# Patient Record
Sex: Female | Born: 1955 | Hispanic: Yes | Marital: Married | Smoking: Former smoker
Health system: Southern US, Community
[De-identification: ages and names within clinical notes are randomized; demographics above are authoritative.]

## PROBLEM LIST (undated history)

## (undated) DIAGNOSIS — Z789 Other specified health status: Secondary | ICD-10-CM

## (undated) HISTORY — DX: Other specified health status: Z78.9

---

## 2020-04-06 ENCOUNTER — Other Ambulatory Visit: Payer: Self-pay

## 2020-04-06 ENCOUNTER — Ambulatory Visit (INDEPENDENT_AMBULATORY_CARE_PROVIDER_SITE_OTHER): Payer: Medicare HMO | Admitting: Sports Medicine

## 2020-04-06 ENCOUNTER — Ambulatory Visit (INDEPENDENT_AMBULATORY_CARE_PROVIDER_SITE_OTHER): Payer: Medicare HMO

## 2020-04-06 ENCOUNTER — Encounter: Payer: Self-pay | Admitting: Sports Medicine

## 2020-04-06 DIAGNOSIS — M170 Bilateral primary osteoarthritis of knee: Secondary | ICD-10-CM | POA: Diagnosis not present

## 2020-04-06 MED ORDER — MELOXICAM 15 MG PO TABS: ORAL_TABLET | ORAL | 3 refills | Status: AC

## 2020-04-06 NOTE — Progress Notes (Addendum)
    Procedures performed today:    Procedure: Real-time Ultrasound Guided  aspiration/injection of the right knee Device: Samsung HS60  Verbal informed consent obtained.  Time-out conducted.  Noted no overlying erythema, induration, or other signs of local infection.  Skin prepped in a sterile fashion.  Local anesthesia: Topical Ethyl chloride.  With sterile technique and under real time ultrasound guidance:  Using an 18-gauge needle aspirated 8 mL of clear, straw-colored fluid, syringe switched and 1 cc Kenalog 40, 2 cc lidocaine, 2 cc bupivacaine injected easily Completed without difficulty  Pain immediately resolved suggesting accurate placement of the medication.  Advised to call if fevers/chills, erythema, induration, drainage, or persistent bleeding.  Images permanently stored and available for review in the ultrasound unit.  Impression: Technically successful ultrasound guided injection.  Independent interpretation of notes and tests performed by another provider:   None.  Brief History, Exam, Impression, and Recommendations:    Primary osteoarthritis of both knees This is a very pleasant 64 year old female, she is had months of pain in both knees, right worse than left with significant swelling, medial joint line pain. Aspiration and injection today per patient request, adding meloxicam, home rehab exercises, return to see me in 1 month.    ___________________________________________ Ihor Austin. Benjamin Stain, M.D., ABFM., CAQSM. Primary Care and Sports Medicine [facility]  Adjunct Instructor of Family Medicine  [employer]

## 2020-04-06 NOTE — Assessment & Plan Note (Signed)
This is a very pleasant 64 year old female, she is had months of pain in both knees, right worse than left with significant swelling, medial joint line pain. Aspiration and injection today per patient request, adding meloxicam, home rehab exercises, return to see me in 1 month.

## 2020-05-04 ENCOUNTER — Ambulatory Visit (INDEPENDENT_AMBULATORY_CARE_PROVIDER_SITE_OTHER): Payer: Medicare HMO | Admitting: Sports Medicine

## 2020-05-04 ENCOUNTER — Telehealth: Payer: Self-pay | Admitting: Sports Medicine

## 2020-05-04 ENCOUNTER — Encounter: Payer: Self-pay | Admitting: Sports Medicine

## 2020-05-04 ENCOUNTER — Other Ambulatory Visit: Payer: Self-pay

## 2020-05-04 DIAGNOSIS — M170 Bilateral primary osteoarthritis of knee: Secondary | ICD-10-CM

## 2020-05-04 MED ORDER — PREDNISONE 50 MG PO TABS: ORAL_TABLET | ORAL | 0 refills | Status: AC

## 2020-05-04 NOTE — Progress Notes (Signed)
Patient states Moderna vaccin in April. Unsure of the dates. Advised her to callback with dates from her vaccine card.

## 2020-05-04 NOTE — Progress Notes (Signed)
    Procedures performed today:    None.  Independent interpretation of notes and tests performed by another provider:   None.  Brief History, Exam, Impression, and Recommendations:    Primary osteoarthritis of both knees Nancy Salas returns, she is a pleasant 64 year old female with bilateral right worse than left knee osteoarthritis, we injected her knee at the last visit, she is doing a lot better, only has very little discomfort, she still has some pain at the medial joint line. I am going to go ahead and get her approved for Orthovisc, we are going to start prednisone, I think she does have some left L4 distribution radicular symptoms as well, and if this does not work then certainly we will proceed with viscosupplementation.    ___________________________________________ Ihor Austin. Benjamin Stain, M.D., ABFM., CAQSM. Primary Care and Sports Medicine [facility]  Adjunct Instructor of Family Medicine  [employer]

## 2020-05-04 NOTE — Assessment & Plan Note (Signed)
Nancy Salas returns, she is a pleasant 64 year old female with bilateral right worse than left knee osteoarthritis, we injected her knee at the last visit, she is doing a lot better, only has very little discomfort, she still has some pain at the medial joint line. I am going to go ahead and get her approved for Orthovisc, we are going to start prednisone, I think she does have some left L4 distribution radicular symptoms as well, and if this does not work then certainly we will proceed with viscosupplementation.

## 2020-05-04 NOTE — Telephone Encounter (Signed)
Orthovisc approval please, right knee, failed NSAIDs, steroid injection, x-ray confirmed

## 2020-05-05 NOTE — Telephone Encounter (Signed)
Started authorization process for orthovisc. - CF

## 2020-08-11 NOTE — Telephone Encounter (Signed)
Patient was approved for Arnoldo Morale and Annette Stable when I called to give her pricing she stated she would speak with her husband and call us back to let us know if she was going to get injections. (04/2020)

## 2020-11-12 IMAGING — DX DG KNEE COMPLETE 4+V*R*
4 series · 4 of 4 positions shown · non-contrast
Comparison: None.

CLINICAL DATA: Several months of bilateral medial knee pain, right
greater than, with swelling. No reported injury.

EXAM:
RIGHT KNEE - COMPLETE 4+ VIEW

[knee lat]
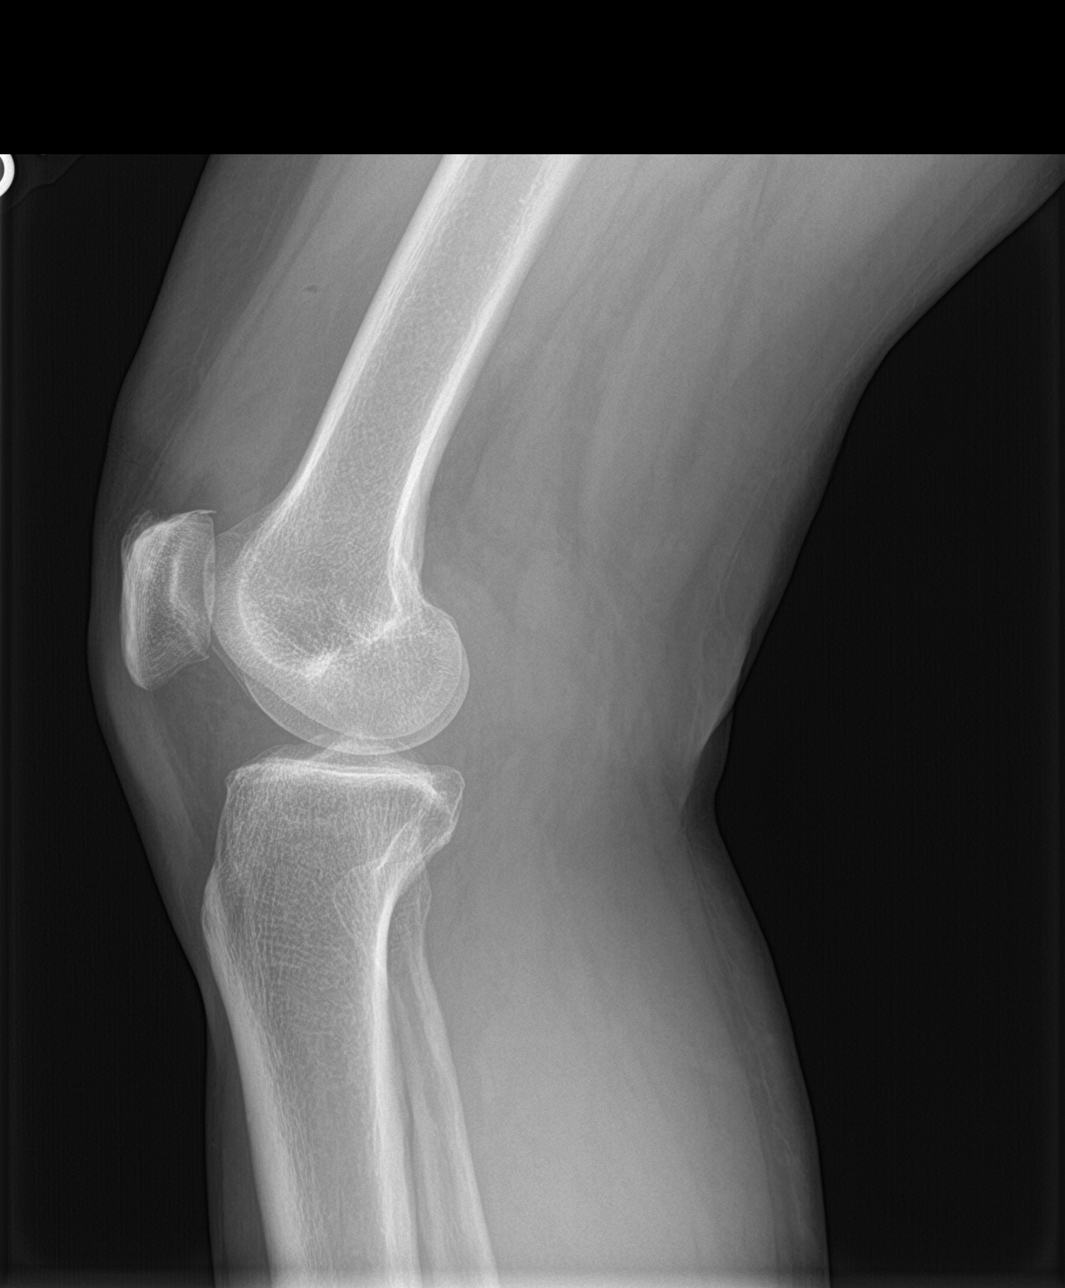

[knee sunrise]
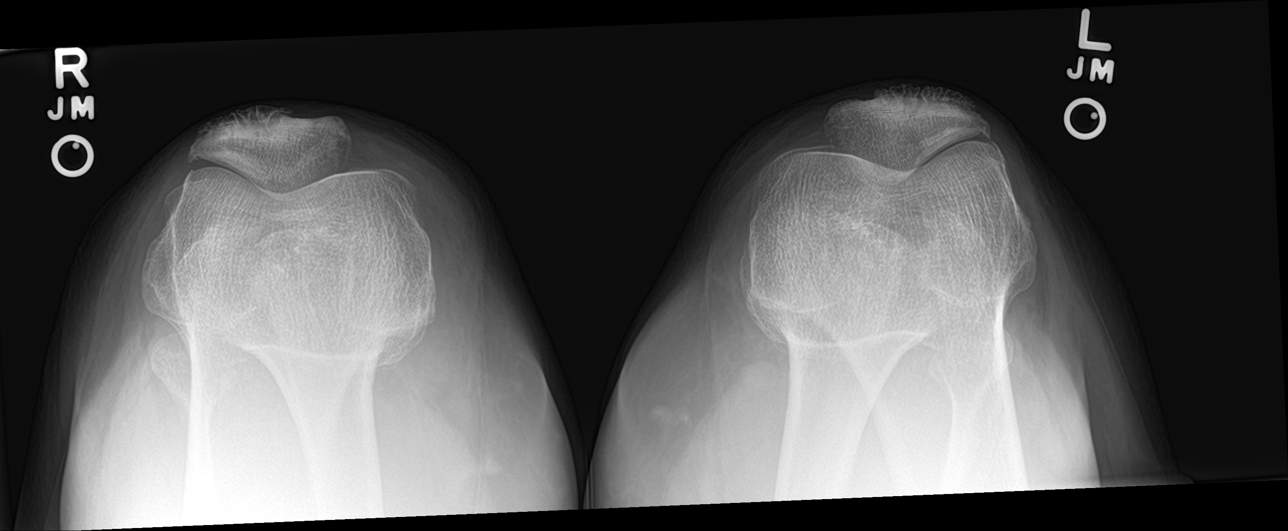

[knee ap bilat standing (1 of 2)]
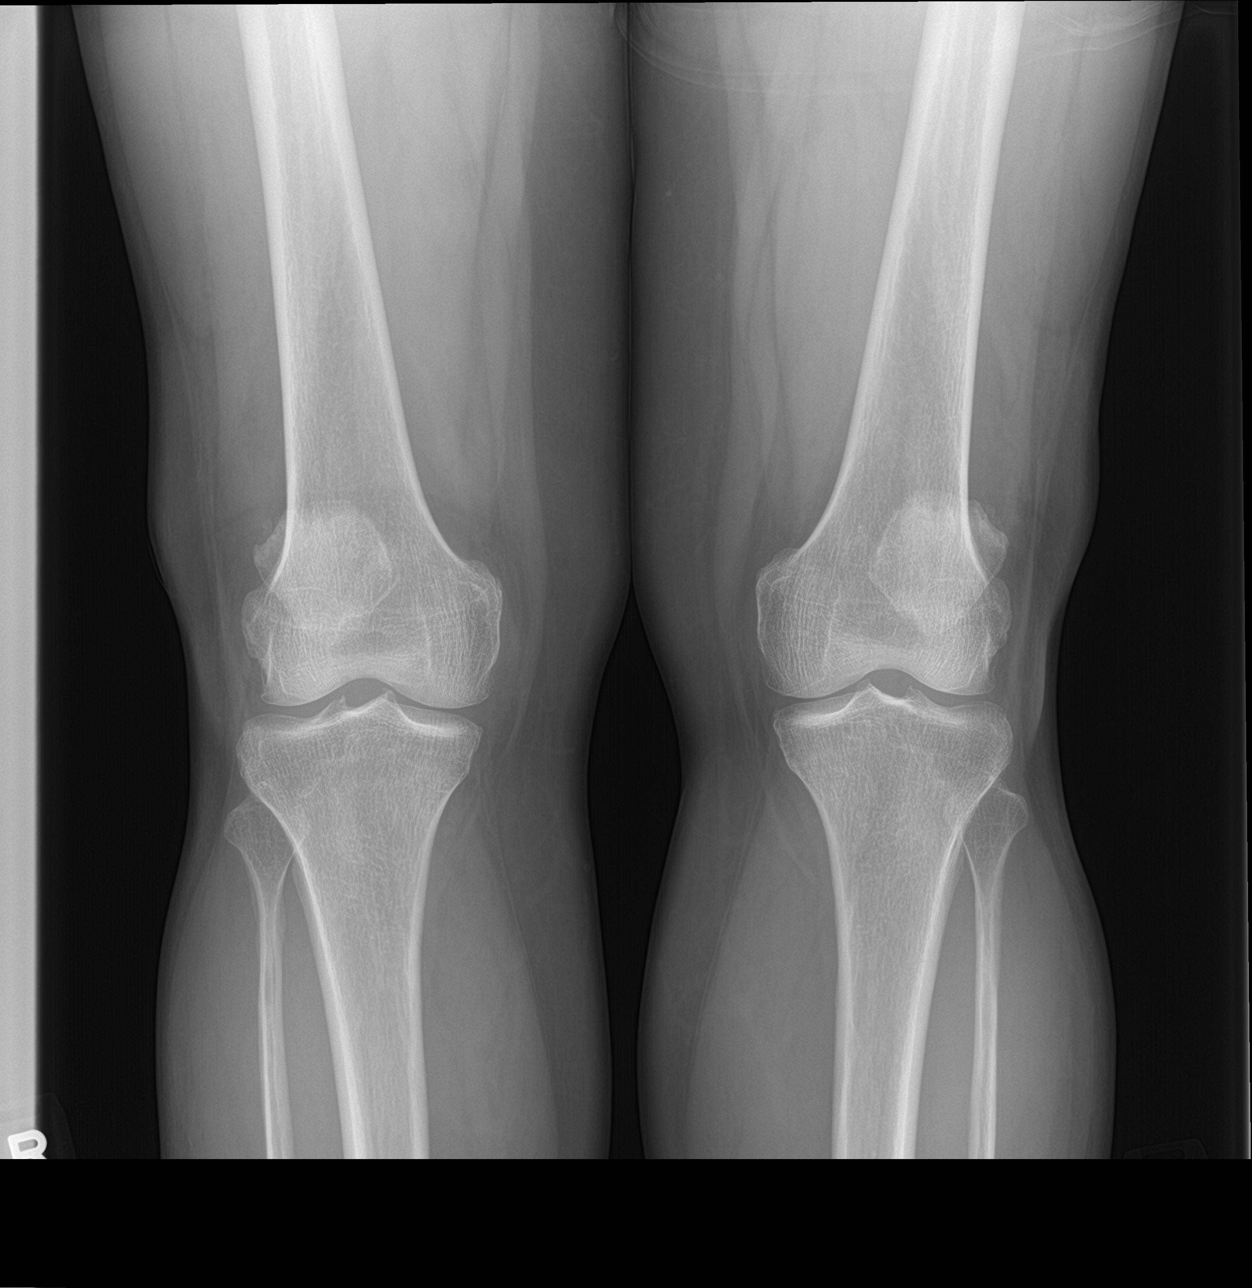

[knee ap bilat standing (2 of 2)]
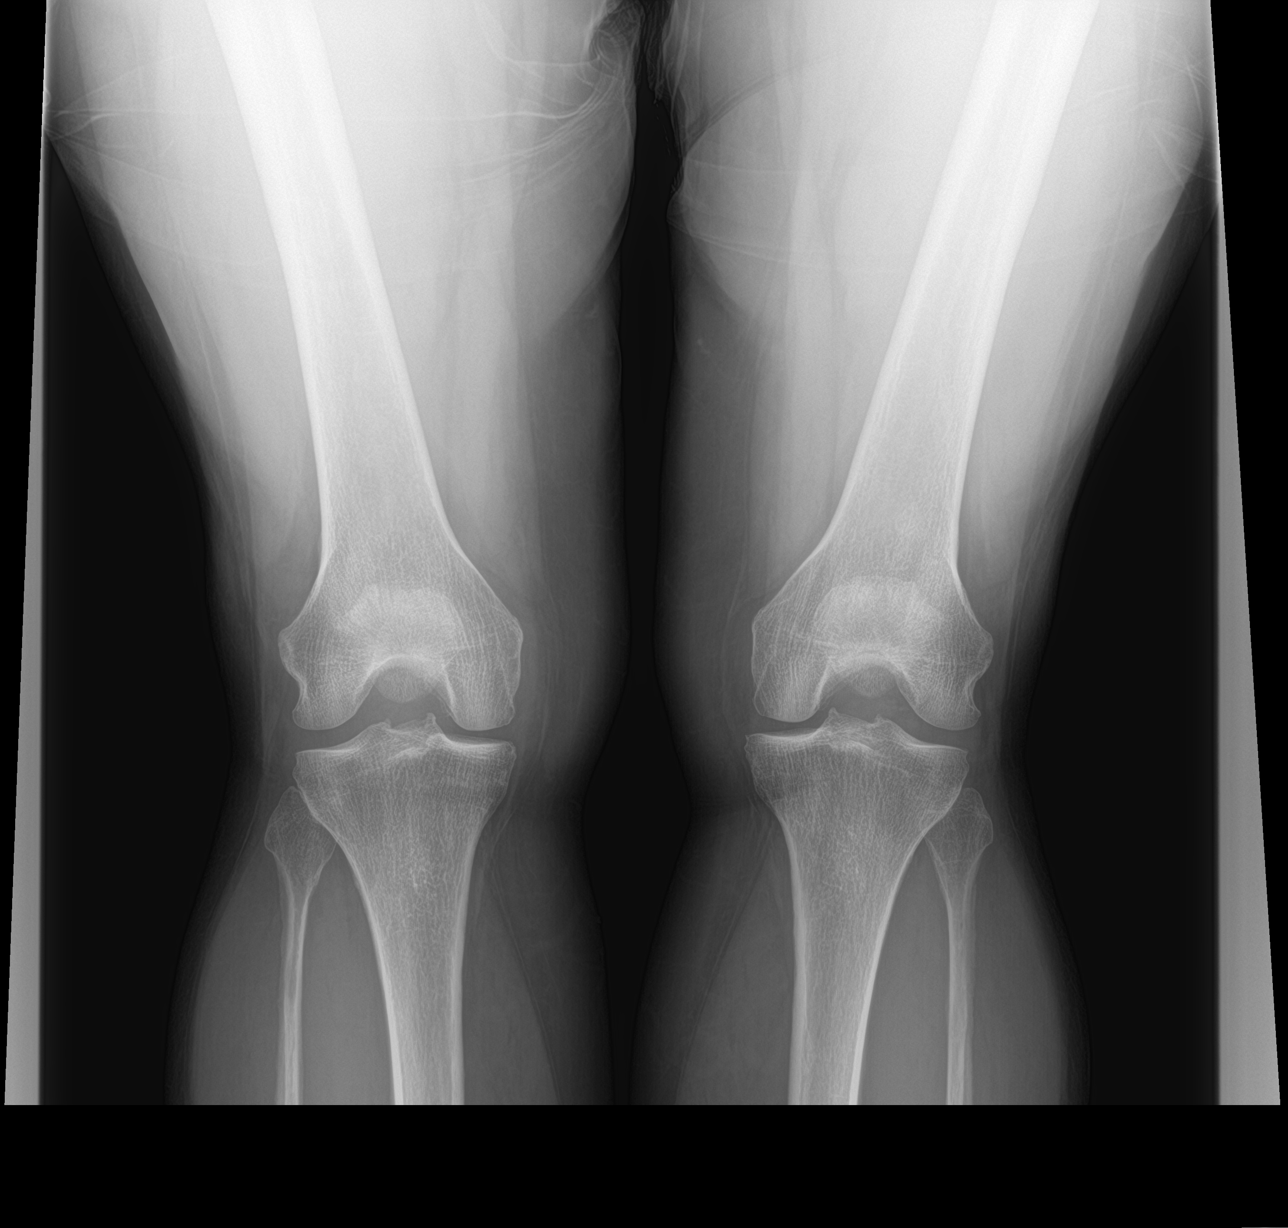

[4 of 4 positions shown; findings below may reference images not displayed]

FINDINGS: No fracture, dislocation or significant joint effusion. No
suspicious focal osseous lesions. Small superior patellar
enthesophyte. Mild patellofemoral compartment osteoarthritis with
preserved medial and lateral compartments. No radiopaque foreign
bodies.
IMPRESSION: Mild patellofemoral compartment osteoarthritis in the right knee.
Small superior patellar enthesophyte.
